# Patient Record
Sex: Male | Born: 1970 | Race: White | Hispanic: No | Marital: Married | Smoking: Never smoker
Health system: Southern US, Community
[De-identification: ages and names within clinical notes are randomized; demographics above are authoritative.]

## PROBLEM LIST (undated history)

## (undated) HISTORY — PX: FOOT SURGERY: SHX648

---

## 2004-06-06 ENCOUNTER — Emergency Department (HOSPITAL_COMMUNITY): Admission: EM | Admit: 2004-06-06 | Discharge: 2004-06-06 | Payer: Self-pay | Admitting: Family Medicine

## 2008-04-25 ENCOUNTER — Encounter: Admission: RE | Admit: 2008-04-25 | Discharge: 2008-04-25 | Payer: Self-pay

## 2008-10-21 ENCOUNTER — Emergency Department (HOSPITAL_COMMUNITY): Admission: EM | Admit: 2008-10-21 | Discharge: 2008-10-22 | Payer: Self-pay | Admitting: Emergency Medicine

## 2008-10-29 ENCOUNTER — Ambulatory Visit (HOSPITAL_COMMUNITY): Admission: RE | Admit: 2008-10-29 | Discharge: 2008-10-29 | Payer: Self-pay | Admitting: Urology

## 2010-03-23 IMAGING — CT CT PELVIS W/O CM
2 of 4 series · 17 of 46 positions shown, 19 images · non-contrast
Comparison: None available.

CT ABDOMEN

CLINICAL DATA: Abdominal and pelvic pain.  Right-sided flank pain.
Question right renal stone.

CT OF THE ABDOMEN AND PELVIS WITHOUT CONTRAST (CT UROGRAM)
TECHNIQUE: Multidetector CT imaging was performed through the
abdomen and pelvis to include the urinary tract.

[Series 2: <(id) stone a/p >(id) · axial · 0.76mm/px · z∈[-466,-16]mm · 14 of 100 slices shown, 16 images]
[im 5/100  soft-tissue]
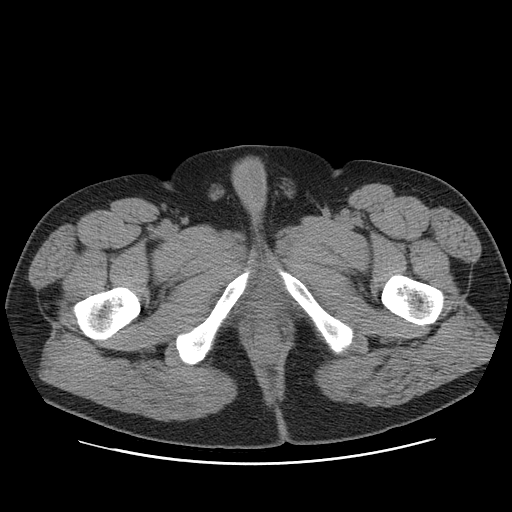
[im 5/100  bone]
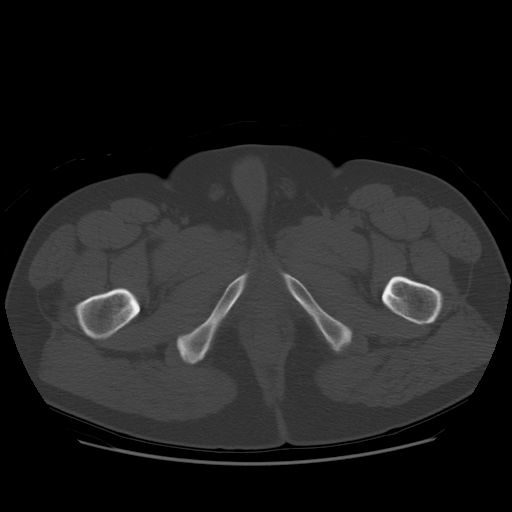
[im 14/100  soft-tissue]
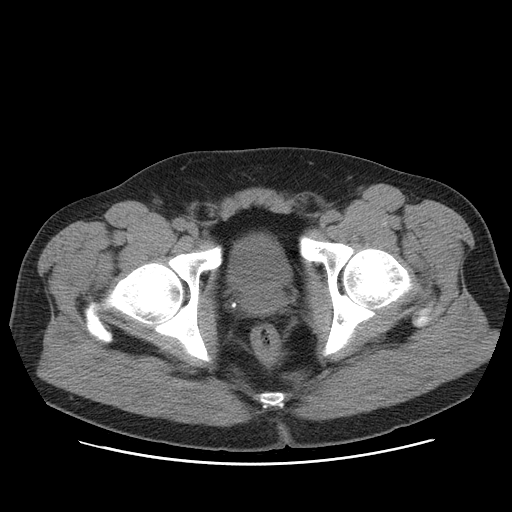
[im 19/100  soft-tissue]
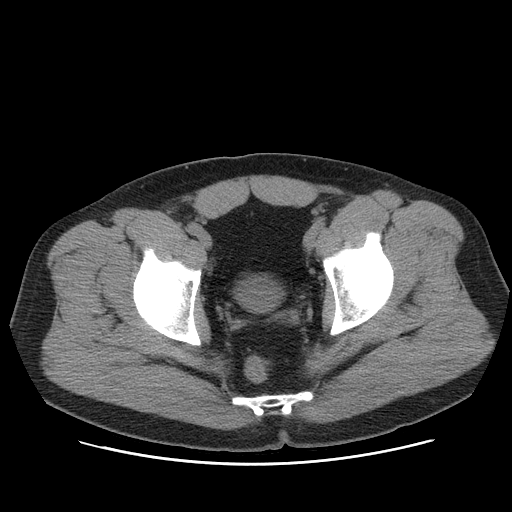
[im 28/100  soft-tissue]
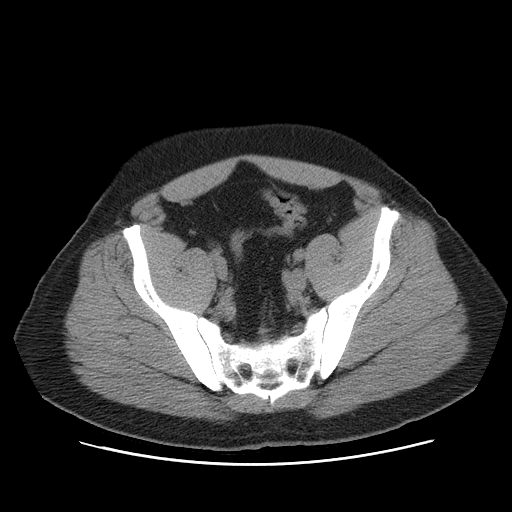
[im 32/100  soft-tissue]
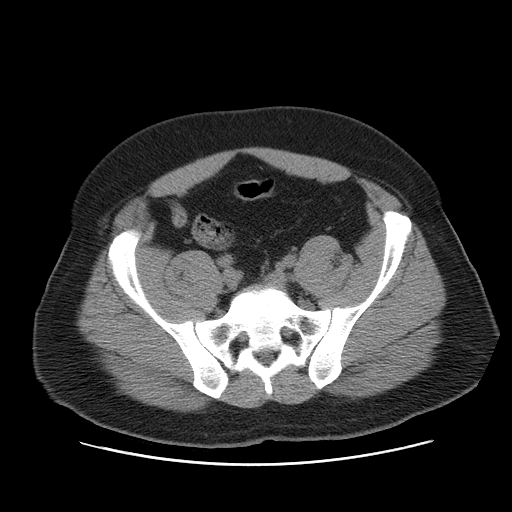
[im 41/100  soft-tissue]
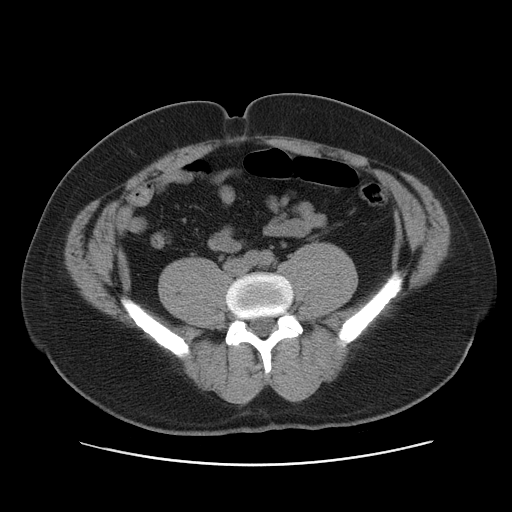
[im 46/100  soft-tissue]
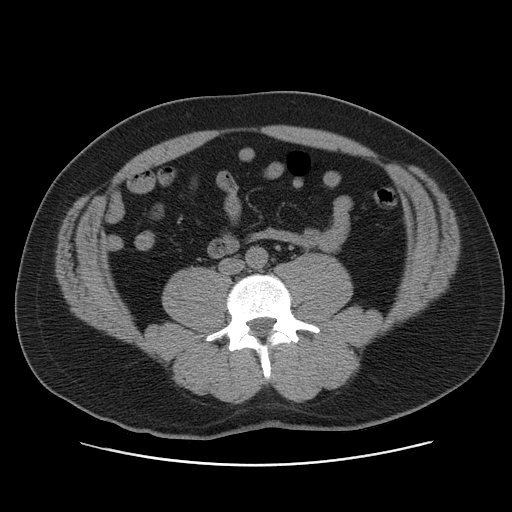
[im 55/100  soft-tissue]
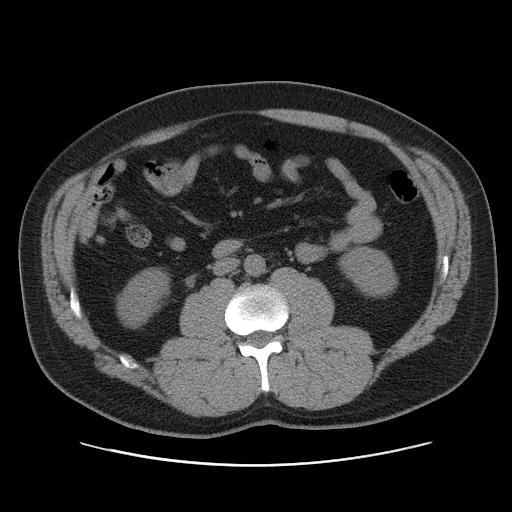
[im 59/100  soft-tissue]
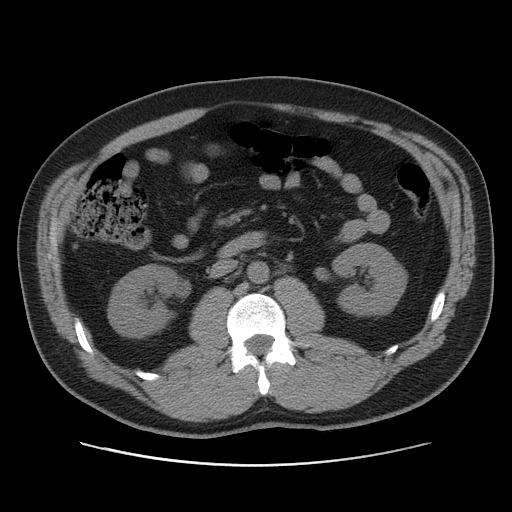
[im 59/100  bone]
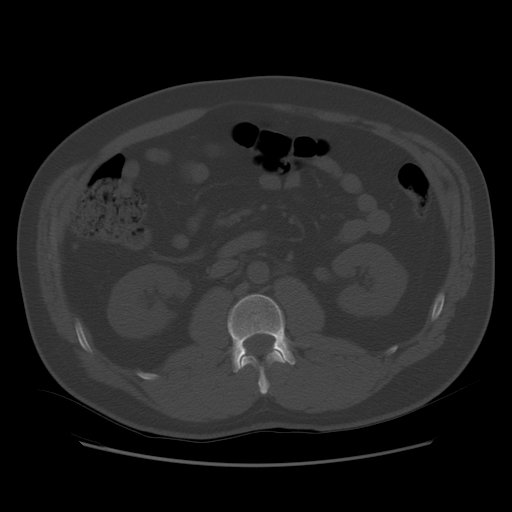
[im 68/100  soft-tissue]
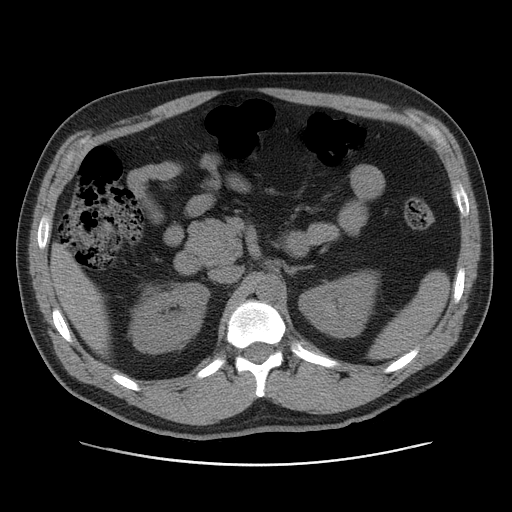
[im 73/100  soft-tissue]
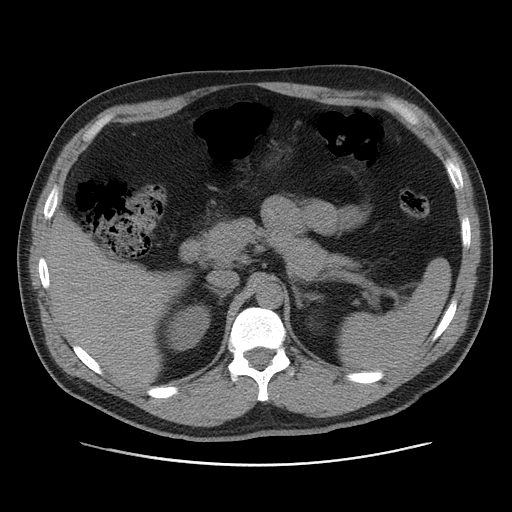
[im 82/100  soft-tissue]
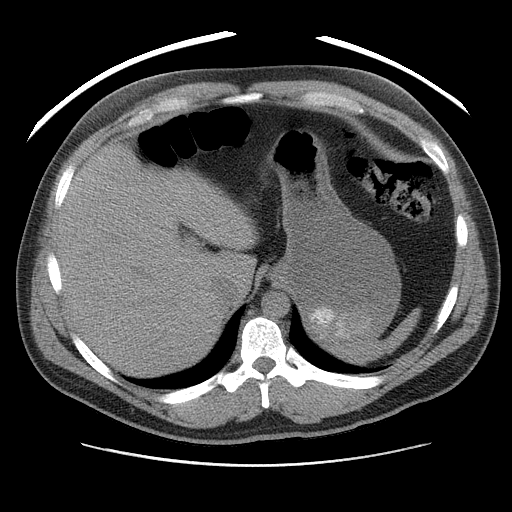
[im 86/100  soft-tissue]
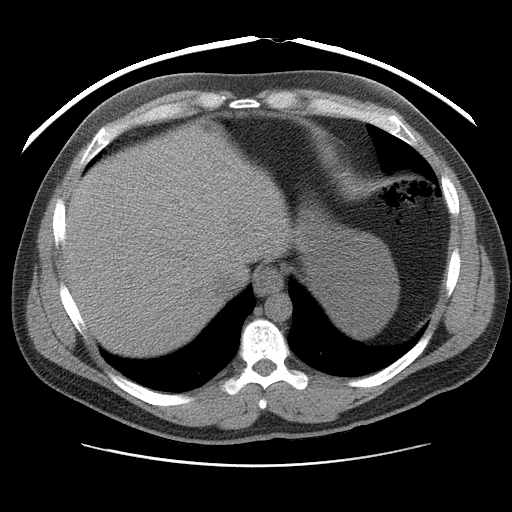
[im 95/100  soft-tissue]
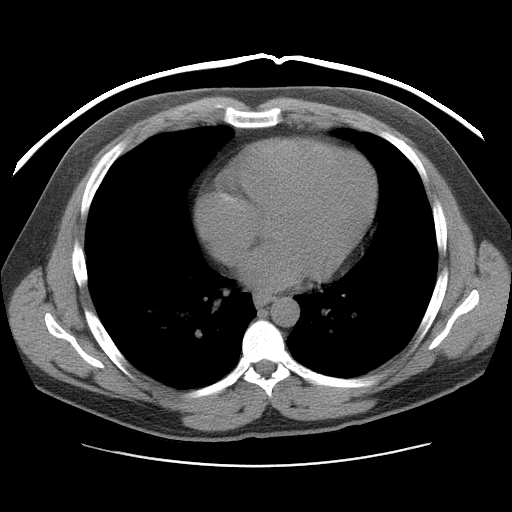

[Series 401: cor · coronal · 0.99mm/px · 3 of 100 slices shown]
[im 34/100  soft-tissue]
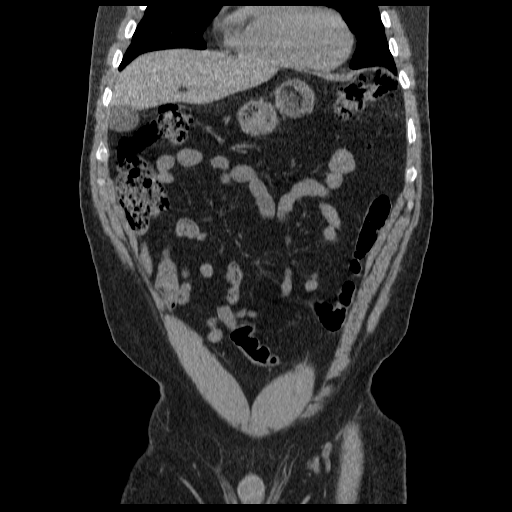
[im 45/100  soft-tissue]
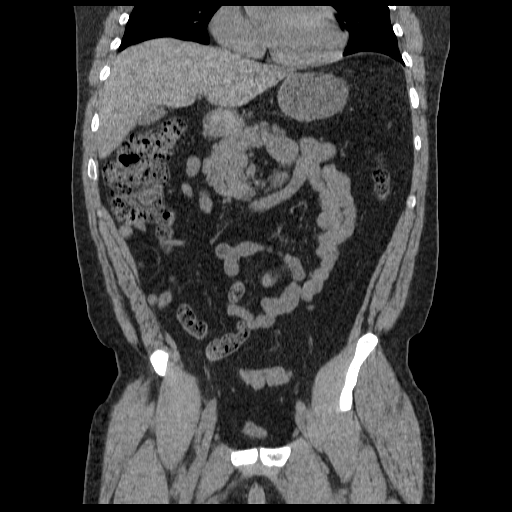
[im 56/100  soft-tissue]
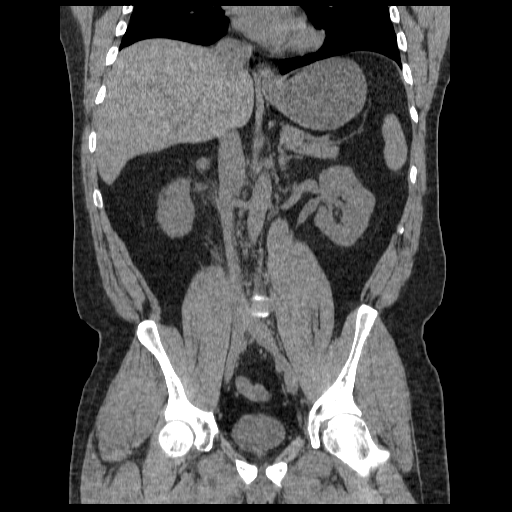

[17 of 46 positions shown; findings below may reference images not displayed]

FINDINGS: The lung bases demonstrate dependent atelectasis.
Unenhanced CT was performed per clinician order.  Lack of IV
contrast limits sensitivity and specificity, especially for
evaluation of abdominal/pelvic solid viscera. .  Grossly the heart
appears within normal limits.  Liver is unremarkable.  Gallbladder
normal.  Spleen normal.  Hollow abdominal viscera grossly appears
within normal limits.  Normal appendix tracks into the right upper
quadrant.  Adrenal glands normal bilaterally.  The left kidney and
ureter appear within normal limits.  The right kidney demonstrates
perinephric stranding.  No residual collecting system calculi are
identified.  At the right ureteropelvic junction, there is a 6 mm x
3 mm x 3 mm calculus.  Peripelvic fat stranding is present.
IMPRESSION: 3 x 3 x 6 mm right UPJ stone. Mild right hydronephrosis.

CT PELVIS
FINDINGS: Large right-sided fecal burden.  No free fluid is
identified.  Urinary bladder normal.  Prostatic calcifications are
present.  Bones appear within normal limits.
IMPRESSION: Large right-sided fecal burden.

## 2010-07-10 LAB — URINE MICROSCOPIC-ADD ON

## 2010-07-10 LAB — URINALYSIS, ROUTINE W REFLEX MICROSCOPIC
Glucose, UA: NEGATIVE mg/dL
Protein, ur: 30 mg/dL — AB

## 2010-07-10 LAB — BASIC METABOLIC PANEL
Chloride: 106 mEq/L (ref 96–112)
GFR calc Af Amer: >60 mL/min (ref >60)
Potassium: 3.9 mEq/L (ref 3.5–5.1)
Sodium: 140 mEq/L (ref 135–145)

## 2010-07-10 LAB — DIFFERENTIAL
Eosinophils Absolute: 0.1 10*3/uL (ref 0.0–0.7)
Lymphs Abs: 1.5 10*3/uL (ref 0.7–4.0)
Monocytes Absolute: 0.7 10*3/uL (ref 0.1–1.0)
Monocytes Relative: 8 % (ref 3–12)
Neutrophils Relative %: 72 % (ref 43–77)

## 2010-07-10 LAB — CBC
HCT: 44.8 % (ref 39.0–52.0)
Hemoglobin: 15.3 g/dL (ref 13.0–17.0)
MCV: 85.4 fL (ref 78.0–100.0)
RBC: 5.24 MIL/uL (ref 4.22–5.81)
WBC: 8.3 10*3/uL (ref 4.0–10.5)

## 2010-07-10 LAB — URINE CULTURE: Colony Count: NO GROWTH

## 2015-04-04 HISTORY — PX: DENTAL SURGERY: SHX609

## 2018-07-15 ENCOUNTER — Encounter (HOSPITAL_COMMUNITY): Payer: Self-pay

## 2018-07-15 ENCOUNTER — Emergency Department (HOSPITAL_COMMUNITY)
Admission: EM | Admit: 2018-07-15 | Discharge: 2018-07-15 | Disposition: A | Discharge status: Home / Self Care | Payer: Commercial Managed Care - PPO | Attending: Emergency Medicine | Admitting: Emergency Medicine

## 2018-07-15 ENCOUNTER — Other Ambulatory Visit: Payer: Self-pay

## 2018-07-15 DIAGNOSIS — Z79899 Other long term (current) drug therapy: Secondary | ICD-10-CM | POA: Diagnosis not present

## 2018-07-15 DIAGNOSIS — N4821 Abscess of corpus cavernosum and penis: Secondary | ICD-10-CM | POA: Insufficient documentation

## 2018-07-15 DIAGNOSIS — L0291 Cutaneous abscess, unspecified: Secondary | ICD-10-CM

## 2018-07-15 DIAGNOSIS — N4889 Other specified disorders of penis: Secondary | ICD-10-CM | POA: Diagnosis present

## 2018-07-15 MED ORDER — CEFTRIAXONE SODIUM 1 G IJ SOLR
1.0000 g | Freq: Once | INTRAMUSCULAR | Status: AC
  Administered 2018-07-15: 14:00:00 1 g via INTRAMUSCULAR
  Filled 2018-07-15: qty 10

## 2018-07-15 MED ORDER — STERILE WATER FOR INJECTION IJ SOLN
INTRAMUSCULAR | Status: AC
  Administered 2018-07-15: 2.1 mL
  Filled 2018-07-15: qty 10

## 2018-07-15 MED ORDER — SULFAMETHOXAZOLE-TRIMETHOPRIM 800-160 MG PO TABS: 1.0000 | ORAL_TABLET | Freq: Two times a day (BID) | ORAL | 0 refills | Status: AC

## 2018-07-15 MED ORDER — HYDROCODONE-ACETAMINOPHEN 5-325 MG PO TABS: 1.0000 | ORAL_TABLET | ORAL | 0 refills | Status: AC | PRN

## 2018-07-15 NOTE — ED Triage Notes (Signed)
Patient reports an abscess under the underside of his penis x 1 week and has gotten progressively worse. Patient has been using hot compresses as directed by an UC and states that has made things worse.

## 2018-07-15 NOTE — ED Provider Notes (Signed)
[facility]-EMERGENCY DEPT Provider Note   CSN: [id number] Arrival date & time: 07/15/18  1205    History   Chief Complaint Chief Complaint  Patient presents with  . Abscess    HPI Sherlock Callery is a 48 y.o. male.     48 year old male presents with complaint of abscess to the shaft of his penis.  Patient states that infection started about a week ago, he went to urgent care a few days ago and was advised to apply warm compresses, not given antibiotics.  Patient states he has been doing warm compresses with a rice pack however area seems to becoming more red and painful.  There is no drainage at this time, no difficulty urinating.  Patient is not a diabetic.  No other complaints or concerns.     History reviewed. No pertinent past medical history.  There are no active problems to display for this patient.   Past Surgical History:  Procedure Laterality Date  . DENTAL SURGERY  2017  . FOOT SURGERY          Home Medications    Prior to Admission medications   Medication Sig Start Date End Date Taking? Authorizing Provider  esomeprazole (NEXIUM) 20 MG capsule Take 20 mg by mouth daily.   Yes [provider]  fluticasone (FLONASE) 50 MCG/ACT nasal spray Place 1 spray into both nostrils daily. 06/03/18  Yes [provider]  levocetirizine (XYZAL) 5 MG tablet Take 5 mg by mouth daily. 06/03/18  Yes [provider]  montelukast (SINGULAIR) 10 MG tablet Take 10 mg by mouth daily. 05/31/18  Yes [provider]  HYDROcodone-acetaminophen (NORCO/VICODIN) 5-325 MG tablet Take 1 tablet by mouth every 4 (four) hours as needed. 07/15/18   Jeannie Fend, PA-C  sulfamethoxazole-trimethoprim (BACTRIM DS,SEPTRA DS) 800-160 MG tablet Take 1 tablet by mouth 2 (two) times daily for 10 days. 07/15/18 07/25/18  Jeannie Fend, PA-C    Family History Family History  Problem Relation Age of Onset  . Cancer Mother   . Hypertension  Mother   . Hypertension Father     Social History Social History   Tobacco Use  . Smoking status: Never Smoker  . Smokeless tobacco: Never Used  Substance Use Topics  . Alcohol use: Yes  . Drug use: Never     Allergies   Mold extract [trichophyton]   Review of Systems Review of Systems  Constitutional: Negative for fever.  Genitourinary: Positive for penile pain and penile swelling. Negative for decreased urine volume, difficulty urinating, discharge, dysuria, frequency, scrotal swelling and testicular pain.  Skin: Positive for color change.  Allergic/Immunologic: Negative for immunocompromised state.  Hematological: Negative for adenopathy.  Psychiatric/Behavioral: Negative for confusion.  All other systems reviewed and are negative.    Physical Exam Updated Vital Signs BP (!) 146/81 (BP Location: Left Arm)   Pulse 86   Temp 98.3 F (36.8 C) (Oral)   Resp 18   Ht 5\' 8"  (1.727 m)   Wt 87.5 kg   SpO2 100%   BMI 29.35 kg/m   Physical Exam Vitals signs and nursing note reviewed. Exam conducted with a chaperone present.  Constitutional:      General: He is not in acute distress.    Appearance: He is well-developed. He is not diaphoretic.  HENT:     Head: Normocephalic and atraumatic.  Pulmonary:     Effort: Pulmonary effort is normal.  Genitourinary:    Penis: Circumcised. Erythema, tenderness and swelling  present. No discharge.      Scrotum/Testes: Normal.    Skin:    General: Skin is warm and dry.     Findings: Erythema present.  Neurological:     Mental Status: He is alert and oriented to person, place, and time.  Psychiatric:        Behavior: Behavior normal.      ED Treatments / Results  Labs (all labs ordered are listed, but only abnormal results are displayed) Labs Reviewed - No data to display  EKG None  Radiology No results found.  Procedures Procedures (including critical care time)  Medications Ordered in ED Medications   cefTRIAXone (ROCEPHIN) injection 1 g (has no administration in time range)     Initial Impression / Assessment and Plan / ED Course  I have reviewed the triage vital signs and the nursing notes.  Pertinent labs & imaging results that were available during my care of the patient were reviewed by me and considered in my medical decision making (see chart for details).  Clinical Course as of Jul 15 1346  Mon Jul 15, 2018  [id number] 48 year old male presents with complaint of abscess on the shaft of the penis for the past week.  Patient is applying warm compresses at home, feels like area is "coming to ahead."  Offered to minimally drain with 18-gauge needle versus starting a biotics, patient would prefer to do compresses antibiotics.  Patient was given IM Rocephin in the ER, discharged home with Bactrim, advised to continue with warm Epson salt water soaks return to ER for worsening symptoms or if not improving the next 2 days.  Patient verbalizes understanding of discharge instructions and plan.   [LM]    Clinical Course User Index [LM] Jeannie FendMurphy, Kellie Murrill A, PA-C      Final Clinical Impressions(s) / ED Diagnoses   Final diagnoses:  Abscess    ED Discharge Orders         Ordered    sulfamethoxazole-trimethoprim (BACTRIM DS,SEPTRA DS) 800-160 MG tablet  2 times daily     07/15/18 1337    HYDROcodone-acetaminophen (NORCO/VICODIN) 5-325 MG tablet  Every 4 hours PRN     07/15/18 1337           Jeannie FendMurphy, Pamalee Marcoe A, PA-C 07/15/18 1348    Tegeler, Canary Brimhristopher J, MD 07/15/18 [phone]

## 2018-07-15 NOTE — Discharge Instructions (Addendum)
Warm Epsom salt soaks for 30 minutes, three times daily. Take Bactrim as prescribed and complete the full course. Recheck in the ER in 2 days if not improving or if worse at any time.
